# Patient Record
Sex: Male | Born: 2005
Health system: Southern US, Community
[De-identification: ages and names within clinical notes are randomized; demographics above are authoritative.]

## PROBLEM LIST (undated history)

## (undated) DIAGNOSIS — R011 Cardiac murmur, unspecified: Secondary | ICD-10-CM

## (undated) DIAGNOSIS — Q800 Ichthyosis vulgaris: Secondary | ICD-10-CM

## (undated) HISTORY — DX: Cardiac murmur, unspecified: R01.1

## (undated) HISTORY — DX: Ichthyosis vulgaris: Q80.0

---
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
## 2023-01-15 ENCOUNTER — Encounter: Payer: No Typology Code available for payment source | Admitting: Family Medicine

## 2023-03-16 ENCOUNTER — Ambulatory Visit (INDEPENDENT_AMBULATORY_CARE_PROVIDER_SITE_OTHER): Payer: 59

## 2023-03-16 DIAGNOSIS — Z23 Encounter for immunization: Secondary | ICD-10-CM | POA: Diagnosis not present

## 2023-03-16 NOTE — Progress Notes (Signed)
Pt in for regular dose flu vaccine.  Injection tolerated well.

## 2023-03-21 ENCOUNTER — Ambulatory Visit: Payer: 59 | Admitting: Psychology

## 2023-03-21 ENCOUNTER — Encounter: Payer: Self-pay | Admitting: Psychology

## 2023-03-21 DIAGNOSIS — F4010 Social phobia, unspecified: Secondary | ICD-10-CM

## 2023-03-21 NOTE — Progress Notes (Signed)
[facility] Counselor Initial Adult Exam  Name: Alan Powell Date: 03/21/2023 MRN: [id number] DOB: 2006-03-29 PCP: Jeoffrey Massed, MD  Time spent: 10:30 - 11:10 am  Guardian/Informant:  Alan Powell - patient    Paperwork requested: No  Met with patient for initial interview.  Patient was at home and session was conducted from therapist's office via video conferencing.  Patient expressed awareness of the limitations related to video sessions and verbally consented to telehealth.    Reason for Visit /Presenting Problem: Referred by Dr. Marvel Plan for ASD/ADHD testing.  Patient reported having trouble focusing and realizing the consequences of his actions until they occur.  Took a few online ASD screening test and scored within the ASD range for those.     Mental Status Exam: Appearance:   Casual and Fairly Groomed     Behavior:  Appropriate and Minimizing  Motor:  Normal  Speech/Language:   Clear and Coherent and Normal Rate  Affect:  Constricted  Mood:  anxious  Thought process:  normal  Thought content:    WNL  Sensory/Perceptual disturbances:    WNL  Orientation:  oriented to person, place, time/date, and situation  Attention:  Good  Concentration:  Good  Memory:  WNL  Fund of knowledge:   Good  Insight:    Fair  Judgment:   Good  Impulse Control:  Good   Developmental History: Early delays - Early milestones typical.  Always had trouble interacting with people.  Kept mostly to himself.   Motor - Good coordination moves fluidly.  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    Speech - Generally speaks clearly and is understood by others but will stutter sometimes when excited.   Self Care - Good Independent - Can do these but requires some prompting by others to do them.   Social - Gets along adequately. Has trouble trouble starting conversations.  Has fear of being embarrassed which gives him pause in speaking to  others. No current close friendships or relationships.     Reported Symptoms:  Takes about 20-30 minutes to fall asleep but stays asleep once he does so.  No recent changes in appetite.  Has trouble waking but adequate energy during the day.  Occasional sadness/depressed mood but nothing extensive (lasts for only a few minutes).  Has surge of self doubt when talks to people (irrational fear).  No general worry but significant social anxiety. Occasional intrusive thought.  No compulsive behavior.  Little trouble paying attention, but easily distracted.  Some losing and forgetting (more than most but not excessive).  Adequate organization but inconsistent.  Frequent restlessness and fidgeting.  No verbal impulsivity but frequent  behavior (not rea.zing consequences until after they happen).  Little peer relations.  Doesn't talk or socialize with them much.  Problems with reading social-emotional nonverbal cues.  No current close friendships or relationships.  No repetitive speech or behavior.  Reads excessively (mostly fantasy stories), will read instead of doing what he is supposed to do. Initially becomes anxious during change and transition but eventually adapts.  No sensory hyper sensitivity.          Risk Assessment: Danger to Self:  No Self-injurious Behavior: No Danger to Others: No Duty to Warn:no Physical Aggression / Violence:No  Access to Firearms a concern: No  Gang Involvement:No  Patient / guardian was educated about steps to take if suicide or homicide risk level increases between visits: n/a While  future psychiatric events cannot be accurately predicted, the patient does not currently require acute inpatient psychiatric care and does not currently meet [facility] involuntary commitment criteria.  Substance Abuse History: Current substance abuse: No     Past Psychiatric History:   No previous psychological problems have been observed Outpatient Providers:None History of Psych  Hospitalization: No  Psychological Testing:  None    Abuse History:  Victim of: No.,  None    Report needed: No. Victim of Neglect:No. Perpetrator of  None   Witness / Exposure to Domestic Violence: No   Protective Services Involvement: No  Witness to [employer] Violence:  No   Family History: No family history on file. Maternal Grandfather with depression  Living situation: the patient lives with their family (parents and two younger brothers 32 &14).  Sister (21 years) is currently away at college. Typical family relations.  Frequently aggravated by 65 year old brother.    Sexual Orientation: Straight male  Relationship Status: single - never been in a romantic relationship. Name of spouse / other:None If a parent, number of children / ages:None  Support Systems: parents  Financial Stress:  No   Income/Employment/Disability: Supported financially by parents while attending school.  Currently Employed part-time, working for the the [employer] 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 Both jobs are part-time.  Does adequately at his jobs, but performs better with the maintenance job as it is less stressful/fast paced.    Military Service: No   Educational History: Education: student Completed HS and currently attending Research scientist (physical sciences).  Does well academically when he applies self but often waits until right before the deadline to complete his work and sometimes does not give himself enough time to finish it. No current accommodations or ever.    Recreation/Hobbies: reading, watch You tube videos, play video games.  Finds a hobby he likes for a while (currently into 3-D printing).    Stressors: Other: No current stressors    Strengths: Working with hands.  Does better with hands on activities in general than writing.    Barriers:  Patient's aversion to talking with strangers (exaggerates how bad the  conversation will be.  This impacts him during interviews as well as social situations.  His procrastination is also a problem.  Becomes anxious anticipating these activities (e.g., updating resume' and avoids them until the last possible moment).      Legal History: Pending legal issue / charges: The patient has no significant history of legal issues.  Medical History/Surgical History: reviewed Past Medical History:  Diagnosis Date   Heart murmur    One echo showed a small secundum ASD, but subsequent echo normal (Dr. Ace Gins).  Ultimate dx Stills murmur and venous hum at one point.--released by Dr. Ace Gins at age 71.   Ichthyosis vulgaris     No past surgical history on file.  Medications: Current Outpatient Medications  Medication Sig Dispense Refill   Multiple Vitamin (MULTIVITAMIN PO) Take by mouth daily.     No current facility-administered medications for this visit.    No Known Allergies  Allergy to dust. No digestion problems No concussions, seizures, or head injuries.    Diagnoses:  Social anxiety disorder  R/O ASD &ADHD  Plan of Care: Patient presents with intense anxiety in social situations, which impairs his ability to speak with others he does not know well.  Patient stated that he fears embarrassment and  makes the situations to be much worse in his mind than they are in reality.  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  Patient suspected having ASD due to his social difficulties and scoring highly on multiple ASD online screening measures.  Testing recommended to evaluate for ASD and ADHD along with other conditions that may be affecting work completion and social interaction.   Test Battery WAIS-IV, CNSVS, BRIEF-A, CAARS-2 (S & O), DASS, ADOS 2 Module 4, SRS-2 (S & O) (WJ-IV - optional)   Bryson Dames, PhD

## 2023-03-21 NOTE — Progress Notes (Signed)
                Rudra Hobbins, PhD 

## 2023-04-03 ENCOUNTER — Encounter: Payer: Self-pay | Admitting: Psychology

## 2023-04-03 ENCOUNTER — Ambulatory Visit (INDEPENDENT_AMBULATORY_CARE_PROVIDER_SITE_OTHER): Payer: 59 | Admitting: Psychology

## 2023-04-03 DIAGNOSIS — F4010 Social phobia, unspecified: Secondary | ICD-10-CM

## 2023-04-03 NOTE — Progress Notes (Signed)
[facility] Counselor/Therapist Progress Note  Patient ID: Alan Powell, MRN: [id number],    Date: 04/03/2023  Time Spent: 8:30 - 11:30 am   Treatment Type: Testing  Met with patient for testing session.  Patient was at the clinic and session was conducted from therapist's office in person.    Reported Symptoms/Reason for Referral: Patient presents with intense anxiety in social situations, which impairs his ability to speak with others he does not know well. Patient stated that he fears embarrassment and makes the situations to be much worse in his mind than they are in reality. 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 Patient suspected having ASD due to his social difficulties and scoring highly on multiple ASD online screening measures. Testing recommended to evaluate for ASD and ADHD along with other conditions that may be affecting work completion and social interaction.   Mental Status Exam: Appearance:  Casual and Fairly Groomed     Behavior: Appropriate  Motor: Normal  Speech/Language:  Clear and Coherent and Normal Rate  Affect: Constricted  Mood: euthymic  Thought process: normal  Thought content:   WNL  Sensory/Perceptual disturbances:   WNL  Orientation: oriented to person, place, time/date, and situation  Attention: Good  Concentration: Good  Memory: WNL  Fund of knowledge:  Good  Insight:   Good  Judgment:  Good  Impulse Control: Good   Risk Assessment: Danger to Self:  No Self-injurious Behavior: No Danger to Others: No Duty to Warn:no Physical Aggression / Violence:No   Subjective: Testing included the WAIS-IV (1.75 hrs. for testing and scoring) along with the CNS Vital signs (0.75 hrs.), BRIEF-2A (0.25 hrs) and CAARS-2 (0.25 hrs.).  Parents sent the CAARS-2 and SRS-2 to complete online prior to next session.    Patient was cooperative and displayed good effort.  Attention and concentration were adequate overall, although patient exhibited several instances of self-correction and asking questions to be repeated  Mood was euthymic with restricted affect and patient used overly formal vocabulary at times.  The results appear representative of current functioning.    Diagnosis:Social anxiety disorder  Plan: Testing to continue next session with the ADOS 2 Module 4 and SRS-2, along with possibly the WJ-IV., followed by report writing and interactive feedback.     Bryson Dames, PhD

## 2023-04-03 NOTE — Progress Notes (Signed)
Alan Dames, PhD

## 2023-04-04 ENCOUNTER — Ambulatory Visit (INDEPENDENT_AMBULATORY_CARE_PROVIDER_SITE_OTHER): Payer: 59 | Admitting: Psychology

## 2023-04-04 ENCOUNTER — Encounter: Payer: Self-pay | Admitting: Psychology

## 2023-04-04 DIAGNOSIS — F4010 Social phobia, unspecified: Secondary | ICD-10-CM

## 2023-04-04 NOTE — Progress Notes (Signed)
[facility] Counselor/Therapist Progress Note  Patient ID: Alan Powell, MRN: [id number],    Date: 04/04/2023  Time Spent: 12:30 - 2:00 am   Treatment Type: Testing  Met with patient for testing session.  Patient was at the clinic and session was conducted from therapist's office in person.    Reported Symptoms/Reason for Referral: Patient presents with intense anxiety in social situations, which impairs his ability to speak with others he does not know well. Patient stated that he fears embarrassment and makes the situations to be much worse in his mind than they are in reality. 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 Patient suspected having ASD due to his social difficulties and scoring highly on multiple ASD online screening measures. Testing recommended to evaluate for ASD and ADHD along with other conditions that may be affecting work completion and social interaction.   Mental Status Exam: Appearance:  Casual and Fairly Groomed     Behavior: Appropriate  Motor: Normal  Speech/Language:  Clear and Coherent and Normal Rate  Affect: Constricted  Mood: euthymic  Thought process: normal  Thought content:   WNL  Sensory/Perceptual disturbances:   WNL  Orientation: oriented to person, place, time/date, and situation  Attention: Good  Concentration: Good  Memory: WNL  Fund of knowledge:  Good  Insight:   Good  Judgment:  Good  Impulse Control: Good   Risk Assessment: Danger to Self:  No Self-injurious Behavior: No Danger to Others: No Duty to Warn:no Physical Aggression / Violence:No   Subjective: Testing included the ADOS 2 Module 4 (1.25 hrs. for testing and scoring) along with the SRS-2 (0.25 hrs.).  Parents were resent the CAARS-2 and to SRS-2 complete online prior to next session as the original email address used was incorrect.  .    Patient was cooperative and displayed  good effort. Attention and concentration were adequate overall, although patient exhibited several instances of self-correction and asking questions to be repeated  Mood was euthymic with restricted affect and patient used overly formal vocabulary at times.  Patient was socially responsive but did not elaborate much or initiate interaction.  The results appear representative of current functioning.    Diagnosis:Social anxiety disorder  Plan: Testing complete. Report writing to be conducted followed by interactive feedback next session.   Bryson Dames, PhD                  Bryson Dames, PhD

## 2023-04-11 ENCOUNTER — Encounter: Payer: Self-pay | Admitting: Psychology

## 2023-04-11 NOTE — Progress Notes (Signed)
Alan Powell is a 17 y.o. male patient Report writing competed ( 3 hrs.).  Interactive feedback to be conducted next session. Report to be attached to the feedback progress note.  Patient/Guardian was advised Release of Information must be obtained prior to any record release in order to collaborate their care with an outside provider. Patient/Guardian was advised if they have not already done so to contact the registration department to sign all necessary forms in order for [place] to release information regarding their care.   Consent: Patient/Guardian gives verbal consent for treatment and assignment of benefits for services provided during this visit. Patient/Guardian expressed understanding and agreed to proceed.    Bryson Dames, PhD

## 2023-04-20 ENCOUNTER — Encounter: Payer: Self-pay | Admitting: Psychology

## 2023-04-20 ENCOUNTER — Ambulatory Visit: Payer: 59 | Admitting: Psychology

## 2023-04-20 DIAGNOSIS — F840 Autistic disorder: Secondary | ICD-10-CM

## 2023-04-20 NOTE — Progress Notes (Addendum)
[facility] Counselor/Therapist Progress Note  Patient ID: Alan Powell, MRN: [id number],    Date: 04/20/2023  Time Spent: 10:30 - 11:00 am   Treatment Type: Testing - Feedback Session  Met with patient to review results of testing.  Patient was at home and session was conducted from therapist's office via video conferencing.  Patient understood the limitations of video appointments and verbally consented to telehealth.       Reported Symptoms: Patient presents with intense anxiety in social situations, which impairs his ability to speak with others he does not know well. Patient stated that he fears embarrassment and makes the situations to be much worse in his mind than they are in reality. 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 Patient suspected having ASD due to his social difficulties and scoring highly on multiple ASD online screening measures. Testing recommended to evaluate for ASD and ADHD along with other conditions that may be affecting work completion and social interaction.   Subjective: Interactive feedback was conducted (1 hr.).  It was discussed how patient met the criterion for Autism Spectrum Disorder along with how this condition affects his ability to learn and relate to others.  Recommendations included discussing results with PCP, developing a visual organization system, and seeking individual counseling to help build social, organization, and emotion regulation skills as well as seeking a young adult support group.  Patient expressed agreement with the results and recommendations.     Total Time of Testing: 8.5 hrs. Testing and Scoring: 4.5 hrs. Interactive Feedback:1 hr. Report Writing: 3 hrs.   Diagnosis: Autism spectrum disorder - Level 1  Plan: Report to be sent to patient and referring provider.     Bryson Dames, PhD

## 2023-04-20 NOTE — Progress Notes (Signed)
Psychological Testing Report - Confidential  Identifying Information:              Patient's Name:   Alan Powell  Date of Birth:              01/02/2006     Age:                 17 years  MRN#:                                   [id number]      Dates of Assessment:  November 26 & 27, 2024         Purpose of Evaluation:  The purpose of the evaluation is to provide diagnostic information and treatment recommendations.  Mr. Relyea was the informant for this evaluation.     Referral Information: Mr. Bendik was a 17 year old Caucasian male who was referred by his Primary Care Physician Dr. Marvel Plan for testing related to suspicion of a neurodevelopmental disorder.  Mr. Companion reported having trouble focusing and realizing the consequences of his actions until after they occur.  He took a few online screening tests for Autism Spectrum Disorder (ASD) and scored within the ASD range on those.     Relevant Background Information:  Mr. Vosburgh reported early developmental milestones to be on time.  He always had trouble interacting with people socially and kept mostly to himself during childhood.  Regarding current development, Mr. Menzel reported having good motor coordination and moving fluidly.  He does not currently participate in sports, but he is physically active working in parks maintenance.  Regarding speech, Mr. Koller indicated that he generally speaks clearly and is understood by others but will stutter sometimes when excited.  Self-care skills were reported to be typically developed.  He indicated having adequate independent skills but requiring prompting by others to complete them.  Socially, Mr. Cooperstein indicated that he gets along adequately with others.  He has trouble starting conversations and fears being embarrassed, which gives him pause in speaking. He does not have any current close friendships or relationships.     Medical history was reported to be significant for Heart murmur  and Ichthyosis vulgaris.  Past surgeries were denied.  An allergy to dust was reported while digestion problems, concussions, seizures, or head injuries were denied.  Mr. Amirian does not take any prescription medication (Multivitamin only).  No previous psychological conditions were reported.  Mr. Gatling has not seen anyone for psychotherapy or been hospitalized for mental health reasons.  Previous psychological testing was denied.                        Educationally, Mr. Fairfield reported that he completed HS and is currently attending [employer]) studying fabrication welding.  He reported performing well academically when he applies himself, but he often waits until right before the deadline to complete his work and sometimes does not give himself enough time to finish it.  He has never received any educational accommodations or services.  Military service was denied.  Mr. Falsetti is supported financially by his parents while he is attending school.  He is currently employed part-time, working for the 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  Both jobs are part-time.  He reported performing adequately at his jobs, but enjoys the maintenance job more, as it is less stressful/fast paced.  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  Recreational interests include reading, watching You Tube videos, and playing video games.  He is currently most interested in 3-D printing.     Mr. Ny is currently living with his parents and two younger brothers (16 &14 years).  His sister (21 years) is currently away at college.  Typical family relations were reported other than frequently being aggravated by his 47 year old brother.  He described his sexual orientation as straight C[employer] gender male.  He  is currently single, is not dating anyone, and does not have any children.  He has never been in a romantic relationship.  Mr. Janicke currently feels most supported by his parents.  A history of mental health conditions with the family was denied, as was a history of abuse, neglect, or trauma during childhood.  Current stressors were denied.  Strengths consist of working with his hands, performing better with hands-on activities than writing.  Barriers to success include Mr. Alonge aversion to talking with strangers, often exaggerating how bad the conversation will be in his mind before speaking to them.  This impacts him during interviews as well as social situations.  Procrastination was also reported a problem.  He becomes anxious anticipating low interest activities requiring extended thought (e.g., updating his resume') and avoids them until the last possible moment.    Presenting Symptomology:  Mr. Mattinson reported that it takes about 20-30 minutes for him to fall asleep, but he stays asleep once he does so.  Recent changes in appetite were denied.  He has trouble waking but exhibits adequate energy during the day.  Occasional sadness/depressed mood were reported but nothing extensive (lasting for only a few minutes).  He has moments of self-doubt when talking to people, although he recognizes this as an irrational fear.  Mr. Tomaino denied general worry but reported having significant social anxiety.  Occasional intrusive thought was indicated without compulsive behavior.  He stated having little trouble paying attention but becoming easily distracted.  Some losing and forgetting was reported but not excessive.  He can organize adequately but is inconsistent in doing so.  Frequent restlessness and fidgeting were reported.  Mr. Rausch denied verbal impulsivity but reported frequent impulsive behavior (not realizing consequences until after they happen).  Mr. Konefal reported having few peer relations.  He  doesn't talk or socialize much and does not have any current close friendships or relationships outside of his family.  He indicated having problems with reading social-emotional and nonverbal cues. Repetitive speech or behavior was denied.  He reads excessively (mostly fantasy stories) and will read instead of doing assigned tasks.  He initially becomes anxious during change and transition but eventually adapts.  Sensory hypersensitivity was denied.                                                  Procedures Administered: Wechsler Adult Intelligence Scale - IV Behavior Rating Inventory for Executive Function - Adult Self Report  Connors Adult ADHD Rating Scale (Self and Other Report) Depression Anxiety and Stress Scale - Self Report Autism Diagnostic Observation Schedule 2 Module 4 Social Responsiveness Scale 2 - Self and  Other Report  Behavioral Observations:  Mr. Robledo was cooperative and displayed good effort. Attention and concentration were adequate overall, although Mr. Goldie exhibited several instances of self-correction and asking questions to be repeated.  Mood was euthymic with restricted affect and Mr. Arseneau used overly formal vocabulary at times.  He was socially responsive but did not elaborate much or initiate interaction.  The results appear representative of current functioning.  Brief mental status indicated typical general orientation and alertness.  Recent, delayed, and immediate memory were intact.   Judgement, knowledge, and insight were good.  Current hallucinations, delusions, and thoughts of self-harm were denied.    Test Results and Interpretation:   General Intellectual Functioning:  Wechsler Adult Intelligence Scale - IV Composite Score Summary  Composite  Sum of Scaled Scores Composite Score Percentile Rank 90% Confidence Interval Qualitative Description  Verbal Comprehension VCI 41 120 91 114-124 High  Perceptual Reasoning FRI 49 136 99 129-139 Very High   Working Memory WMI 29       122 93 115-126 High  Processing Speed PSI 20 100 50 93-107 Average  Full Scale FSIQ        129 120 91 116-123 High   The WAIS-IV was used to assess Mr. Nhan's performance across four areas of cognitive ability. When interpreting these scores, it is important to view the results as a snapshot of current intellectual functioning. As measured by the WAIS-IV, Mr. Medal's Full Scale IQ score fell within the high range when compared to same age peers (CIQ = 120).  Mr. Pinera's performance was relatively consistent across the Primary Index Scores, with strength noted in Perceptual Reasoning (PRI = 136) and relative weakness noted with Processing speed (PSI = 100).  The difference was statistically and clinically significant.  Verbal Comprehension (VCI = 120) and Working Memory (WMI = 122) were both high.    This indicates very strong visual learning ability with above age typical language understanding and memory.  While working quickly was much weaker than his other abilities it was still at an age typical level.   Overall, Mr. Mcelmurray appears to have well developed reasoning and comprehension.   Attention and Processing:                                                       CNS Vital Signs   Domain Scores Standard Score Percentile VI** Above Average Low Average Low  Neurocognition Index  100 50 Yes  X    Composite Memory 89 23 Yes   X   Verbal Memory 94 34 Yes  X    Visual Memory 90 25 Yes  X    Psychomotor Speed 90 25 Yes  X    Reaction Time* 103 58 Yes  X    Complex Attention* 117 87 Yes X     Cognitive Flexibility 101 53 Yes  X    Processing Speed 86 18 Yes   X   Executive Function 101 53 Yes  X    Social Acuity 121 92 Yes X     Working Memory 119 90 Yes X     Sustained Attention 113 81 Yes X     Simple Attention 112 79 Yes X     Motor Speed 97 42 Yes  X     The results of  the CNS Vital Signs testing indicated average overall neurocognitive processing  ability, at a level well below measured intellectual ability (high).  Regarding areas related to attention problems, simple, sustained, and complex attention were high average to above average, while cognitive flexibility and executive function were average.  These are the domains most closely associated with attention deficits.  Motor/psychomotor speed was average, as was reaction time, while processing speed was low average, indicating typical responsiveness and hand/eye speed with mildly slow thinking speed on computerized measures.  Visual and verbal memory were average, indicating age typical and relatively equal ability to remember images and words.  Working Civil [employer] fast streamer, used for multitasking and problem solving, was a strength in the above average range.  Social Acuity (reading emotional expression) was also strong in the high range.  The results suggest that Mr. Hellums appears to have well developed typical ability attending to simple and complex activities with typical ability shifting attention and attending systematically. The only impaired area was mildly slow thinking speed.  The results are not consistent with individuals who have attention deficits.  The results were deemed valid across all measures.        Executive Function: BRIEF-2A Self Report Score Summary Table Scale/Index/Composite Raw score T score Percentile  Inhibit 17 69 97  Self-Monitor 10 59 83  Behavior Regulation Index (BRI) 27 65 93  Shift 13 66 95  Emotional Control 13 54 71  Emotion Regulation Index (ERI) 26 61 84  Initiate 20 72 99  Working Memory 20 74 >99  Plan/Organize 21 76 >99  Task-Monitor 14 68 98  Organization of Materials 16 62 88  Cognitive Regulation Index (CRI) 91 75 99  Global Executive Composite (GEC) 144 71 97  Mr. Marcano completed the Self-Report Form of the Behavior Rating Inventory of Executive Function, Second Edition-Adult Version (BRIEF2A) on 04/03/2023. There are no missing item responses in  the protocol. Responses are reasonably consistent. Mr. Ausborn's ratings of himself do not appear overly negative. There were 0 atypical responses to infrequently endorsed items. In the context of these validity considerations, ratings of Mr. Comp's executive function exhibited in everyday behavior indicate some areas of concern.  The overall index score, the GEC, was moderately elevated (GEC T = 71, %ile = 97). The Emotion Regulation Index (ERI) score was within normal limits (ERI T = 61, %ile = 84), but the Behavior Regulation Index (BRI) score was mildly elevated (BRI T = 65, %ile = 93) and the Cognitive Regulation Index (CRI) score was highly elevated (CRI T = 75, %ile = 99). Within these summary indicators, all the individual scales can be calculated. One or more of the individual BRIEF2A scale T scores were elevated, suggesting that Mr. Offield exhibits difficulty with some aspects of executive function. Concerns are noted with his ability to resist impulses, adjust well to changes, get going on tasks and activities and independently generate ideas, sustain working memory, plan, and organize his approach to problem solving appropriately, and be appropriately cautious in his approach to tasks and check for mistakes. Mr. Ost's elevated scores on scales reflecting problems with fundamental behavioral and/or emotional regulation (Inhibit, Emotional Control, and Shift) suggest that more global problems with self-regulation are having a negative effect on active cognitive problem solving (elevated CRI).  Mr. Zakowski ability to be aware of his functioning in social settings, react to events appropriately, and keep materials and belongings reasonably well-organized is not described as problematic.  Behavioral - Emotional Functioning: CAARS 2 Scales - Self report  T-score 90% CI Percentile Guideline # of Elevated Items  Inattention/ Executive Dysfunction 70 67-73 97th Very Elevated 24/30   Hyperactivity 59 54-64 81st Not Elevated 4/13  Impulsivity 58 53-63 76th Not Elevated 3/13  Emotional Dysregulation 58 53-63 74th Not Elevated 1/9  Negative Self-Concept 64 59-69 95th Slightly Elevated 3/7    T-score 90% CI Percentile Guideline Symptom Count ?  ADHD Inattentive Symptoms  68 64-72 94th Elevated 8/9  ADHD Hyperactive/ Impulsive Symptoms 61 56-66 86th Slightly Elevated 4/9  Total ADHD Symptoms 65 60-70 91st Elevated n/a   Self-report ratings of behavior and emotional functioning indicated moderate overall attention and behavior regulation difficulty within clinically significant levels.  On the CAARS 2 Self Report, Mr. Relaford positively endorsed, as occurring often or very often, 8 of 9 items for intention/poor organization and 4 of 9 items for hyperactivity and poor impulse control.  Endorsement of at least 5 items in either category is considered at-risk for ADHD.  T-scores indicated a severely high elevation in inattention/executive dysfunction, with typically developed impulsivity, hyperactivity, and emotion regulation.  Self-concept was rated as mildly elevated.    CAARS 2 Scales - Other report  T-score 90% CI Percentile Guideline # of Elevated Items  Inattention/ Executive Dysfunction 61 58-64 84th Slightly Elevated 11/30  Hyperactivity 43 39-47 29th Not Elevated 1/13  Impulsivity 51 46-56 59th Not Elevated 2/13  Emotional Dysregulation 41 36-46 15th Not Elevated 0/9  Negative Self-Concept 63 58-68 91st Slightly Elevated 3/7        CAARS 2 Scales - Other report - Continued DSM Symptom Scales     Raw Score T-score 90% CI Percentile Guideline Symptom Count ?  ADHD Inattentive Symptoms 11 55 51-59 72nd Not Elevated 2/9  ADHD Hyperactive/ Impulsive Symptoms   3 45 40-50 35th Not Elevated 1/9  Total ADHD Symptoms 14 51 46-56 55th Not Elevated n/a   Parent report ratings of behavior and emotional functioning indicated typical overall attention and behavior  regulation below clinically significant levels.  On the CAARS 2 Self Report, Mr. Sthill mother Hallet Obenour positively endorsed, as occurring often or very often, 2 of 9 items for intention/poor organization and 1 of 9 items for hyperactivity and poor impulse control.  Endorsement of at least 5 items in either category is considered at-risk for ADHD.  T-scores indicated a mildly high elevation in inattention/executive dysfunction, with typically developed impulsivity, hyperactivity, and emotion regulation.  Self-concept was rated as mildly elevated.    Ratings for general emotional functioning indicated mild current emotional distress as Mr. Tornow's responses on the Depression, Anxiety, and Stress Scale indicated mildly depressed mood with mild to moderate anxiety and mild stress.  Four of 21 items were highly endorsed including lack of initiative, nervous energy, fear of embarrassment, and lack of tolerance.  The scores are above typical but below clinically significant levels.    Regarding symptoms of ASD, information from the ADOS 2 Module 4 indicated difficulty with several aspects of social communication and reciprocal social interaction with some restricted-repetitive behavior observed during this administration.  His severity score indicates a moderate likelihood of ADHD.  Within the area of communication, Mr. Engelberg mostly spoke in longer elaborate sentences.  While there was little variation in his tone of voice, and he occasionally used overly intellectual and technical words.  He needed direct prompting and specific questions to report non-routine events, while offering occasional spontaneous personal information.  Mr. Steffan  responded with brief comments to personal information from the examiner and did not ask any socially related questions.  Reciprocal conversation appeared less than typically developed for his age and cognitive level.  He demonstrated adequate use of descriptive gestures, but  only when directed to do so, with occasional use of emphatic but not emotional gestures.  Socially, Mr. Seacat exhibited poorly modulated eye contact.  His range of facial expression seemed adequate, but he infrequently directed facial expression to the examiner.  His expression of enjoyment in interaction appeared typical.  Mr. Bigner exhibited difficulty elaborating on personal emotions (described in physical terms only) as well as spontaneously and accurately identify emotions in others during pictures or stories (can do so when asked).  Mr. Sumi demonstrated less than expected insight into the nature of social relationships, including his own role in these relationships, for his age and cognitive level.  His engagement in responsibility seemed appropriate and consistent for with his developmental level.  Mr. Huckeba initiated interaction one time, related to an interest of his.  While he was more responsive to social advances from the examiner, his responses were often brief and limited in reciprocity.  Social rapport was difficult to maintain due to Mr. Stoke's minimal elaboration, resulting in one sided interaction.  Mr. Sakamoto demonstrated creative object use but had difficulty organizing his thoughts for story telling.  Regarding behavior, Mr. Dona did not demonstrate any sensory seeking behavior, odd movement, or self-injury.  Compulsive behavior was not observed, and he did not discuss any overly intense interests.     Social Responsiveness Scale - 2 - Self Report                                Awr             Cog             Com           Mot            RRB Raw score               10                17                 39              29              17                           T-score                    61                67                 75              86              68  Awr = Social Awareness    Com = Social Museum/gallery exhibitions officer = Social Cognition    Mot = Social Motivation  RRB = Restricted  Interests and Repetitive Behavior  DSM-5 Compatible Subscales Raw score T-score  Social Communication and Interaction        95   76 Severe  Restricted Interests and Repetitive Behavior       17   68 Moderate      Current functioning regarding ASD related symptoms reported during daily activities was assessed using the Social Responsiveness Scale - 2.  Mr. Locastro completed the SRS-2 regarding his behavior.  On this measure, the T Score of 75 was in the Moderate range. Scores in this range indicate deficiencies in reciprocal social behavior that are clinically significant and lead to substantial interference with everyday social interactions. Such scores are typical for individuals with autism spectrum disorders of moderate severity. Social communication was rated as severely elevated while restricted repetitive behavior was rated within the moderately elevated range.    Social Responsiveness Scale - 2 - Other Report                                Awr             Cog             Com           Mot            RRB Raw score               10                12                33               27               13                           T-score                    61                58                70               82               62  Awr = Social Awareness    Com = Social Museum/gallery exhibitions officer = Social Cognition    Mot = Social Motivation  RRB = Restricted Interests and Repetitive Behavior  DSM-5 Compatible Subscales Raw score T-score  Social Communication and Interaction        82   71 Moderate   Restricted Interests and Repetitive Behavior       13   62 Mild      Mr. Agan's parents Tammy Sours and Melvine Phaneuf also completed the SRS-2 regarding his ASD related behavior.  On this measure, the T Score of 69 was in the Moderate range. Scores in this range indicate deficiencies in reciprocal social behavior that are clinically significant and lead to substantial interference with everyday social  interactions. Such scores are typical for individuals with autism spectrum disorders of moderate severity. Social communication was rated within the moderate range while restricted repetitive behavior was mildly impaired.    Summary:  Mr. Fridman was evaluated during November 2024 related to a history of social anxiety, and interaction difficulty.  Mr. Dunman presents with intense anxiety in social situations, which impairs his ability to speak with others he does not  know well. Mr. Bournes stated that he fears embarrassment and makes the situations to be much worse in his mind than they really are. 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  He suspected having ASD due to his social difficulties and scoring highly on multiple ASD online screening measures. Testing was recommended to evaluate for ASD and ADHD along with other conditions that may be affecting work completion and social interaction. Test results indicated high overall intelligence (K-BIT 2), with relatively very high Nonverbal Reasoning, high Verbal Comprehension and Working Memory, and average Processing Speed.  Testing for neurocognitive functioning (CNS Vital Signs) indicated average processing overall, with average or better scores for all attention and processing measures other than processing speed, which was low average.  Ratings for executive function indicated great difficulty in this area with clinically significant impairment regarding behavior and cognitive regulation with specific difficulty indicated regarding inhibition, shifting attention, initiation, working memory, planning, and thought organization.  Self-report ratings for behavior functioning suggested several ADHD-Inattentive related symptoms, while parental ratings indicated typical functioning.  Mild emotional distress was rated regarding anxiety and depressive symptoms.  Testing for Autism Spectrum  indicated observed difficulty with several aspects of social reciprocity and communication while some instances of restricted repetitive behavior were also observed.  Self-report ratings indicated severe impairment with social communication with moderately high restricted repetitive behavior, while ratings from his parents indicated moderate social communication deficits and mild restricted repetitive behavior.  Mr. Morrisson appears to meet the criterion for ASD, based on testing and developmental history, with current behavior indicating above criterion levels of social communication deficits with an at-risk level of restricted-repetitive behavior in at least two settings.  Higher functioning adults often do not show atypical to others, suggesting that the mild levels of RRB's observed are likely more prevalent.  Criterion for ADHD was not met as testing and ratings did not indicate significant attention and processing deficits across settings.  While anxiety and depressed mood were but currently below clinically significant levels, these should be monitored closely to ensure that they do not increase and become more impairing. See below for recommendations.       Diagnostic Impression: DSM 5  Autism Spectrum Disorder - Level 1 - Needs Support  Recommendations: Recommendations are to discuss results with Primary Care Physician or Psychiatrist regarding the results of this evaluation.  Medication for attention deficits is not recommended as general attention appears adequately developed.  Issues with procrastination and work completion appear more executive function than attention based.  Medication for depressed mood and anxiety is also not recommended currently but may be helpful should these symptoms become interfering with daily functioning.  Individual counseling is recommended to help Mr. Sizemore with developing social interaction, lessening social anxiety, and increasing organization skills.  Mr.  Ohlinger would benefit from a structured therapy approach that focuses on the teaching of emotional expression, self-advocacy, assertive communication, calming, social interaction skills, cognitive flexibility, executive function compensation, and perspective taking.  Mental alertness/energy can also be raised by increasing exercise, improving sleep, eating a healthy diet, and managing depression/stress.  Consult with a physician regarding any changes to physical regimen.  Community support and socialization for people with ASD can also be gained through the Autism Society of [address] and [employer], as well as young adult transition groups through the [employer] and [employer].       Salvatore Decent Eual Lindstrom, Ph.D. Licensed Psychologist - [facility] [place] Licensed  psychologist [phone]                Books/Websites ASD Adults About Autism Autism Spectrum Disorders: The Complete Guide to Understanding Autism, Asperger's Syndrome, Pervasive Developmental Disorder, and Other ASDs by Lemmie Evens  Preparing for Life Social Skills Training for Adults by Dub Amis  The Hidden Curriculum: Practical Solutions for Understanding Unstated Rules in Social Situations by Alinda Dooms, Thomos Lemons, & Jack Quarto  Discussing Diagnosis with Affected Individual, Family Members, and Friends Autism.What Does it Mean to Me? by Kara Mead Friend and Relative's Guide to Supporting the Family with Autism: How Can I Help? By Dulce Sellar  Transition to Adulthood Neurodivergent Mind: Thriving in a World that [employer] for [place] by [employer] Unmasking Autism: Discovering the Health Net of Neurodiversity by Reinaldo Meeker, PhD  Websites Mindfulness Tips and practice - Online MBSR/Mindfulness (Free) (palousemindfulness.com)  Online Therapy [facility] is accepting referrals for our DBT-ST group. This group is designed for Autistic Adults, 18 and older, have  completed a general education high school diploma, and have a minimum Verbal IQ of 80. Length of participation varies with recommended length being 6-12 months.  Sex Education ASD Adults Autism-Asperger's and Sexuality:  [employer].  Dorene Sorrow and [facility].  [employer], 2002.  Written for adolescents and young adults with autism spectrum disorders by a husband-and-wife team who are both on the autism spectrum.  Websites: [employer], [employer]. - World Occupational hygienist in Market researcher Resources & Conferences   [employer] is the [employer] designed for individuals (and parents / professionals of those) with Autism, Asperger's Syndrome, ADHD, PDDs, and other neurological differences.  Provides a discussion forum, where members communicate with each other, an article section, with exclusive articles and how-to guides, a blogging feature, and more.    Strategies for Patients with ADHD/Executive Dysfunction *Remember that ADHD is not a knowledge problem, it is a performance problem.  In many situations, we would be helping to try to set up the environment to give them reminders of when to use various strategies. This could be things like:  Setting a timer to help track the passing of time closer and breaking large tasks into more manageable chunks.  Using calendars, reminders, sticky notes, etc. to assist in reducing forgetfulness.  Setting needed items out or packing them in the car the day prior to when they will be needed. Helping them to create reasonably long to-do lists and storing them where easily accessible and making extra copies (if physical) in case they are misplaced.  Finding an organization system that improves organization but is maintainable (e.g., a box where items not relevant to the current task go, color coded file system, or a Psychiatrist). Recording themselves or having a trusted other person give cues to them in situations in which they perceive struggles as  occurring to assist in building self-awareness.  The 4Rs: Read just one paragraph, recite out loud in a soft voice or whisper what was important in that material, write that material down in a notebook, then review what you just wrote.  Work on tasks with others that are generally organized to assist in sustaining attention and being more publicly accountable than doing the work solo.  Proper diet and exercise can improve executive functioning abilities.  Having a trusted other assist in managing finances, making a monthly budget sheet of all monthly expenses, setup a system that automatically put a percentage of earnings  into a retirement and/or savings account, do not go to a store if there is nothing you NEED to buy, if using credit cards - put a "For Emergency Use Only" sticker on it," and avoid casinos. If the ADHD symptoms are being described as causing significant impairment or being difficult to manage, a conversation with their PCP or a psychiatrist about medication would likely be warranted. Medication is the most effective treatment for ADHD, and non-stimulant and stimulant options exist.      Resources for Patients with ADHD/Executive Dysfunction Applications: RescueTime. Tracks your activities on phone and/or computer to determine how productive you have been, and what distracted you. Free two-week trial. Focus@Will . Uses engineered audio that may reduce distractions and assist with focus. Free 15-day trial. Freedom. Allows you to highlight days and times you want to block yourself from certain sites or apps. Free trial. Vesta Mixer.  Allows you to input your bank accounts and creates a visual layout of various information about your financial goals, budget management, alerts, etc. May offer a free trial. Boomerang. Gives you the option to schedule times an email is sent as well as to see if others have received or opened your email. 10 messages free per month and a free trial of premium  version. IFTTT. Uses "channels" to create various actions (e.g., if you are mentioned in an email to highlight it in your inbox and if you miss a call to add it to a to-do list). Free and premium versions. Unroll.me. Cleans up your email by unsubscribing from what you do not want to receive while still getting everything you do. Free. Finish. Allows you to divide two-list tasks into short-term, mid-term, and long-term as well as how much time is left for a task. Focus mode hides non-priority tasks. [employer]ilent. Turns your phone ringer on and off based on specified calendars, geo-fences, timers, etc. $3.99. Freakyalarm. Makes you solve math problems to disable an alarm. $1.99. Wake N Shake. Makes you vigorously shake your phone to stop the alarm. $.99. Todoist. Allows you to add sub-tasks to tasks as well as includes email and Web plugins to make it work across system. Premium has location-based reminders, calendar sync, productive tracking, etc. Sleep Cycle. Utilizes your phone's motion sensors to pick up on movement while you are asleep. The alarm will wake you as early as 30 minutes before your alarm based on your lightest phase of sleep as well as showing you how daily activities affect your sleep quality.  Higher education careers adviser dysfunction can significantly impact an individual's ability to function at home, at school, at work, or in [employer]. Several different approaches to executive function intervention have been developed by neuropsychologists, rehabilitation specialists, and others that are aimed at helping individuals cope with executive dysfunction. One type of intervention involves the application of cognitive remediation techniques that typically emphasize repeated practice with tasks, such as memory and attention tasks, that are intended to improve the deficient skill. This form of intervention has demonstrated some success in treating people with executive  dysfunction, such as individuals who have traumatic brain injury.   Another type of intervention involves teaching compensatory strategies. These strategies are designed to circumvent rather than directly improve deficits and also have demonstrated effectiveness in a number of patient populations. Still others emphasize the interaction of the individual within the environment and how antecedent environmental modifications, or accommodations can facilitate executive functions. It should be noted that these approaches to dealing with executive dysfunction need not  be mutually exclusive and many intervention programs are characterized by a hybrid approach.   Compensatory strategies themselves can take several forms including using external aides (e.g., use of a notebook), learning cognitive strategies (e.g., verbalization), and making environmental modifications (e.g., keeping workspace clutter-free). Research has demonstrated that both healthy adults as well as individuals who have executive deficits commonly rely on external aids for executive and other cognitive processes. The probability of success with compensatory strategies can be enhanced by building on an individual's existing strategies, systematically training the new strategies, and tailoring the compensatory strategies to the individual's unique needs and environmental contexts. More frequent use of aides or strategies and the use of a greater variety of aides is helpful when it comes to memory, and this also may hold true for executive dysfunction.   For individuals with more severe executive dysfunction and/or those with additional deficits in other domains of functioning, such as memory and learning, assimilating, and applying compensatory strategies and aides may be difficult. Providing such individuals with a high degree of external support can help them successfully complete tasks with less error and improve self-esteem. Prolonged reliance on  external support without any systematic plan for developing some degree of independent skill, however, may interfere with the individual's ability to learn from new experiences. In many cases, across the range of severity, behavioral change may best be achieved through supportive practice of routines within pertinent "natural" contexts such as the home, where fostering the development of behaviors and thoughts that are elicited by regular cues in the environment is facilitated. This form of compensatory strategy relies on habit formation, also referred to as implicit memory or procedural learning, aspects of which are relatively intact in many conditions where executive dysfunction is common.   For individuals who have very severe cognitive dysfunction, instructing someone other than the individual in question (e.g., caregiver, spouse, teacher, supervisor) on appropriate environmental modifications may be the most helpful approach.  In the context of a systematic approach, some suggested compensatory strategies for dealing with executive dysfunction follow. These recommendations are generic in nature and can be tailored to individual needs based on severity of deficit, preserved strengths, and environmental demands.               Bryson Dames, PhD

## 2023-04-20 NOTE — Progress Notes (Signed)
                Alan Hobbins, PhD 

## 2024-02-08 ENCOUNTER — Ambulatory Visit: Payer: Self-pay

## 2024-02-08 DIAGNOSIS — Z23 Encounter for immunization: Secondary | ICD-10-CM
